# Patient Record
Sex: Female | Born: 1990 | Race: Black or African American | Hispanic: No | Marital: Single | Smoking: Never smoker
Health system: Southern US, Community
[De-identification: ages and names within clinical notes are randomized; demographics above are authoritative.]

## PROBLEM LIST (undated history)

## (undated) DIAGNOSIS — D649 Anemia, unspecified: Secondary | ICD-10-CM

## (undated) HISTORY — DX: Anemia, unspecified: D64.9

## (undated) HISTORY — PX: EYE SURGERY: SHX253

---
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
## 2009-03-19 ENCOUNTER — Emergency Department (HOSPITAL_COMMUNITY): Admission: EM | Admit: 2009-03-19 | Discharge: 2009-03-19 | Payer: Self-pay | Admitting: Emergency Medicine

## 2010-12-01 LAB — COMPREHENSIVE METABOLIC PANEL
Albumin: 3.7
Alkaline Phosphatase: 44 — ABNORMAL LOW
BUN: 10
Potassium: 3.8
Total Protein: 6.9

## 2010-12-01 LAB — DIFFERENTIAL
Basophils Relative: 0
Lymphocytes Relative: 6 — ABNORMAL LOW
Lymphs Abs: 0.6 — ABNORMAL LOW
Monocytes Absolute: 0.6
Monocytes Relative: 6
Neutro Abs: 8

## 2010-12-01 LAB — URINE MICROSCOPIC-ADD ON

## 2010-12-01 LAB — POCT URINALYSIS DIP (DEVICE)
Hgb urine dipstick: NEGATIVE
Ketones, ur: >=160 — AB
Specific Gravity, Urine: 1.015
pH: 7

## 2010-12-01 LAB — URINALYSIS, ROUTINE W REFLEX MICROSCOPIC
Bilirubin Urine: NEGATIVE
Glucose, UA: NEGATIVE
Hgb urine dipstick: NEGATIVE
Nitrite: NEGATIVE
Specific Gravity, Urine: 1.036 — ABNORMAL HIGH
pH: 6.5

## 2010-12-01 LAB — CBC
HCT: 32.5 — ABNORMAL LOW
Platelets: 349
RDW: 15.3

## 2010-12-01 LAB — WET PREP, GENITAL

## 2010-12-01 LAB — GC/CHLAMYDIA PROBE AMP, GENITAL
Chlamydia, DNA Probe: NEGATIVE
GC Probe Amp, Genital: NEGATIVE

## 2012-03-11 ENCOUNTER — Emergency Department (INDEPENDENT_AMBULATORY_CARE_PROVIDER_SITE_OTHER): Payer: BC Managed Care – PPO

## 2012-03-11 ENCOUNTER — Encounter (HOSPITAL_COMMUNITY): Payer: Self-pay | Admitting: Emergency Medicine

## 2012-03-11 ENCOUNTER — Emergency Department (HOSPITAL_COMMUNITY)
Admission: EM | Admit: 2012-03-11 | Discharge: 2012-03-11 | Disposition: A | Discharge status: Home / Self Care | Payer: BC Managed Care – PPO | Source: Home / Self Care | Attending: Emergency Medicine | Admitting: Emergency Medicine

## 2012-03-11 DIAGNOSIS — Z23 Encounter for immunization: Secondary | ICD-10-CM

## 2012-03-11 DIAGNOSIS — R7611 Nonspecific reaction to tuberculin skin test without active tuberculosis: Secondary | ICD-10-CM

## 2012-03-11 MED ORDER — INFLUENZA VIRUS VACC SPLIT PF IM SUSP
0.5000 mL | Freq: Once | INTRAMUSCULAR | Status: AC
  Administered 2012-03-11: 0.5 mL via INTRAMUSCULAR

## 2012-03-11 NOTE — ED Provider Notes (Signed)
Chief Complaint  Patient presents with  . 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 She had this placed 03/05/2012 at a [facility] for a job. She went back for a TB test reading 2 days later and it was positive at 20 mm. She can not recall ever having had a TB skin test previously or a chest x-ray. She has no prior history of TB, known exposure to TB, and no symptoms including no fever, chills, night sweats, weight loss, cough, hemoptysis, shortness of breath, or chest pain. She was in [place] in 2010 in [place] in 2012.  Review of Systems:  Other than noted above, the patient denies any of the following symptoms. Systemic:  No fever, chills, sweats, fatigue, myalgias, headache, or anorexia. Eye:  No redness, pain or drainage. ENT:  No earache, nasal congestion, rhinorrhea, sinus pressure, or sore throat. Lungs:  No cough, sputum production, wheezing, shortness of breath.  Cardiovascular:  No chest pain, palpitations, or syncope. GI:  No nausea, vomiting, abdominal pain or diarrhea. GU:  No dysuria, frequency, or hematuria. Skin:  No rash or pruritis.  PMFSH:  Past medical history, family history, social history, meds, and allergies were reviewed.   Physical Exam:   Vital signs:  BP 110/60  Temp 98.9 F (37.2 C) (Oral)  Resp 18  SpO2 100%  LMP 02/26/2012 General:  Alert, in no distress. Eye:  PERRL, full EOMs.  Lids and conjunctivas were normal. ENT:  TMs and canals were normal, without erythema or inflammation.  Nasal mucosa was clear and uncongested, without drainage.  Mucous membranes were moist.  Pharynx was clear, without exudate or drainage.  There were no oral ulcerations or lesions. Neck:  Supple, no adenopathy, tenderness or mass. Thyroid was normal. Lungs:  No respiratory distress.  Lungs were clear to auscultation, without wheezes, rales or rhonchi.  Breath  sounds were clear and equal bilaterally. Heart:  Regular rhythm, without gallops, murmers or rubs. Abdomen:  Soft, flat, and non-tender to palpation.  No hepatosplenomagaly or mass. Skin:  Clear, warm, and dry, without rash or lesions.  Radiology:  Dg Chest 2 View  03/11/2012  *RADIOLOGY REPORT*  Clinical Data: Positive TB skin test.  CHEST - 2 VIEW  Comparison: None.  Findings: Lungs clear.  Heart size and pulmonary vascularity normal.  No effusion.  Visualized bones unremarkable.  IMPRESSION: No acute disease   Original Report Authenticated By: D. Andria Rhein, MD    Course in Urgent Care Center:   She was given a flu shot today.  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 Therefore she needs to take INH 300 mg daily for 9 months. I referred the [facility] health Department for this.  Plan:   1.  The following meds were prescribed:   New Prescriptions   No medications on file   2.  The patient was instructed in symptomatic care and handouts were given. 3.  The patient was told to return if becoming worse in any way, if no better in 3 or 4 days, and given some red flag symptoms that would indicate earlier return.    Reuben Likes, MD 03/11/12 [phone]

## 2012-03-11 NOTE — ED Notes (Signed)
Waiting discharge papers 

## 2012-03-11 NOTE — ED Notes (Signed)
Pt had tb testing done at [facility] and test was positive. Pt here for chest x ray.

## 2012-07-30 ENCOUNTER — Encounter (INDEPENDENT_AMBULATORY_CARE_PROVIDER_SITE_OTHER): Payer: BC Managed Care – PPO

## 2012-07-31 ENCOUNTER — Telehealth: Payer: Self-pay

## 2012-07-31 NOTE — Telephone Encounter (Signed)
Pt came in on Monday and did not stay and would like a refund of her payment from 07/30/12   Best number [phone]

## 2012-08-01 NOTE — Telephone Encounter (Signed)
Noted refund request in transaction inquiry, tried to call pt but mailbox is full. Pt will receive refund via check after approved.

## 2012-08-01 NOTE — Telephone Encounter (Signed)
Spoke with pt, advised refund will be mailed. Confirmed address. OK

## 2012-09-25 ENCOUNTER — Emergency Department (HOSPITAL_COMMUNITY)
Admission: EM | Admit: 2012-09-25 | Discharge: 2012-09-25 | Disposition: A | Discharge status: Home / Self Care | Payer: BC Managed Care – PPO | Source: Home / Self Care | Attending: Emergency Medicine | Admitting: Emergency Medicine

## 2012-09-25 ENCOUNTER — Encounter (HOSPITAL_COMMUNITY): Payer: Self-pay | Admitting: Emergency Medicine

## 2012-09-25 DIAGNOSIS — N390 Urinary tract infection, site not specified: Secondary | ICD-10-CM

## 2012-09-25 LAB — POCT URINALYSIS DIP (DEVICE)
Bilirubin Urine: NEGATIVE
Glucose, UA: NEGATIVE mg/dL
Ketones, ur: NEGATIVE mg/dL

## 2012-09-25 LAB — POCT PREGNANCY, URINE: Preg Test, Ur: NEGATIVE

## 2012-09-25 MED ORDER — IBUPROFEN 600 MG PO TABS: 600.0000 mg | ORAL_TABLET | Freq: Four times a day (QID) | ORAL | Status: DC | PRN

## 2012-09-25 MED ORDER — CEPHALEXIN 500 MG PO CAPS: 500.0000 mg | ORAL_CAPSULE | Freq: Four times a day (QID) | ORAL | Status: AC

## 2012-09-25 NOTE — ED Notes (Signed)
Pt reports lower back pain x 3 days along with urgency to urinate, concentrated/cloudy urine. Pt has not tried any otc meds for symptoms. Denies fever and any other symptoms.

## 2012-09-25 NOTE — ED Provider Notes (Signed)
CSN: [id number]     Arrival date & time 09/25/12  1434 History     First MD Initiated Contact with Patient 09/25/12 1450     Chief Complaint  Patient presents with  . Urinary Tract Infection    lower back pain x 3 days.    (Consider location/radiation/quality/duration/timing/severity/associated sxs/prior Treatment) HPI Comments: Patient presents urgent care describing that for the last 2-3 days she's been having pressure burning and discomfort with urination. Her urine looks concentrated and looks cloudy. She feels she has a urinary tract infection the last time she had this was when she was 22 years of age. She denies any fevers nausea vomiting or flank pain but does have lower back pain or discomfort. She is not nauseous and has not had any vomiting. Without movement and at rest she denies any pain in her abdomen.  She has been taking an over-the-counter product for the last 2-3 days, for urinary symptoms.  Patient is a 22 y.o. female presenting with urinary tract infection. The history is provided by the patient.  Urinary Tract Infection This is a new problem. The current episode started more than 2 days ago. The problem occurs constantly. The problem has not changed since onset.Associated symptoms include abdominal pain. Pertinent negatives include no shortness of breath. Exacerbated by: urination. Nothing relieves the symptoms. She has tried nothing for the symptoms. The treatment provided no relief.    Past Medical History  Diagnosis Date  . Anemia    Past Surgical History  Procedure Laterality Date  . Eye surgery     History reviewed. No pertinent family history. History  Substance Use Topics  . Smoking status: Never Smoker   . Smokeless tobacco: Not on file  . Alcohol Use: Yes     Comment: occasional   OB History   Grav Para Term Preterm Abortions TAB SAB Ect Mult Living                 Review of Systems  Constitutional: Negative for fever, chills, activity change  and appetite change.  Respiratory: Negative for shortness of breath.   Gastrointestinal: Positive for abdominal pain.  Genitourinary: Positive for dysuria, urgency and frequency. Negative for flank pain, vaginal bleeding, vaginal discharge, difficulty urinating and pelvic pain.  Skin: Negative for pallor and rash.    Allergies  Review of patient's allergies indicates no known allergies.  Home Medications   Current Outpatient Rx  Name  Route  Sig  Dispense  Refill  . cephALEXin (KEFLEX) 500 MG capsule   Oral   Take 1 capsule (500 mg total) by mouth 4 (four) times daily.   28 capsule   0   . ibuprofen (ADVIL,MOTRIN) 600 MG tablet   Oral   Take 1 tablet (600 mg total) by mouth every 6 (six) hours as needed for pain.   15 tablet   0    BP 121/78  Pulse 75  Temp(Src) 98.6 F (37 C) (Oral)  Resp 18  SpO2 100%  LMP 09/11/2012 Physical Exam  Nursing note and vitals reviewed. Constitutional: She appears well-developed and well-nourished.  Eyes: Conjunctivae are normal. No scleral icterus.  Abdominal: Soft. Normal appearance. There is no hepatosplenomegaly. There is tenderness in the suprapubic area. There is no rigidity, no rebound, no guarding, no CVA tenderness, no tenderness at McBurney's point and negative Murphy's sign. No hernia.  Musculoskeletal:       Back:  Skin: No rash noted. No erythema.    ED Course  Procedures (including critical care time)  Labs Reviewed  POCT URINALYSIS DIP (DEVICE) - Abnormal; Notable for the following:    Hgb urine dipstick MODERATE (*)    Protein, ur 30 (*)    Nitrite POSITIVE (*)    Leukocytes, UA MODERATE (*)    All other components within normal limits  URINE CULTURE  POCT PREGNANCY, URINE   No results found. 1. Urinary tract infection     MDM  Symptoms and exam were most consistent with uncomplicated urinary tract infection. Start patient on Keflex to be taken every 6 hours for the first 48 hours and then to space out  every 8 hours. Instructed patient to drink abundant fluids. Discuss with patient symptoms that should warrant her return for further evaluation more specifically nausea vomiting fevers or flank pain. Otherwise patient to start with antibiotics immediately.  Patient agrees with treatment plan followup care as necessary. A urine culture has been ordered    Jimmie Molly, MD 09/25/12 1554

## 2012-09-27 LAB — URINE CULTURE

## 2012-09-27 NOTE — ED Notes (Signed)
Urine culture: >100,000 colonies E. Coli.  Pt. adequately treated with Keflex. Cynthia Guerra 09/27/2012

## 2012-09-28 NOTE — Progress Notes (Signed)
This encounter was created in error - please disregard.

## 2013-07-19 IMAGING — CR DG CHEST 2V
2 series · 2 of 2 positions shown · non-contrast
Comparison: None.

CLINICAL DATA: Positive TB skin test.

CHEST - 2 VIEW

[view not recorded (1 of 2)]
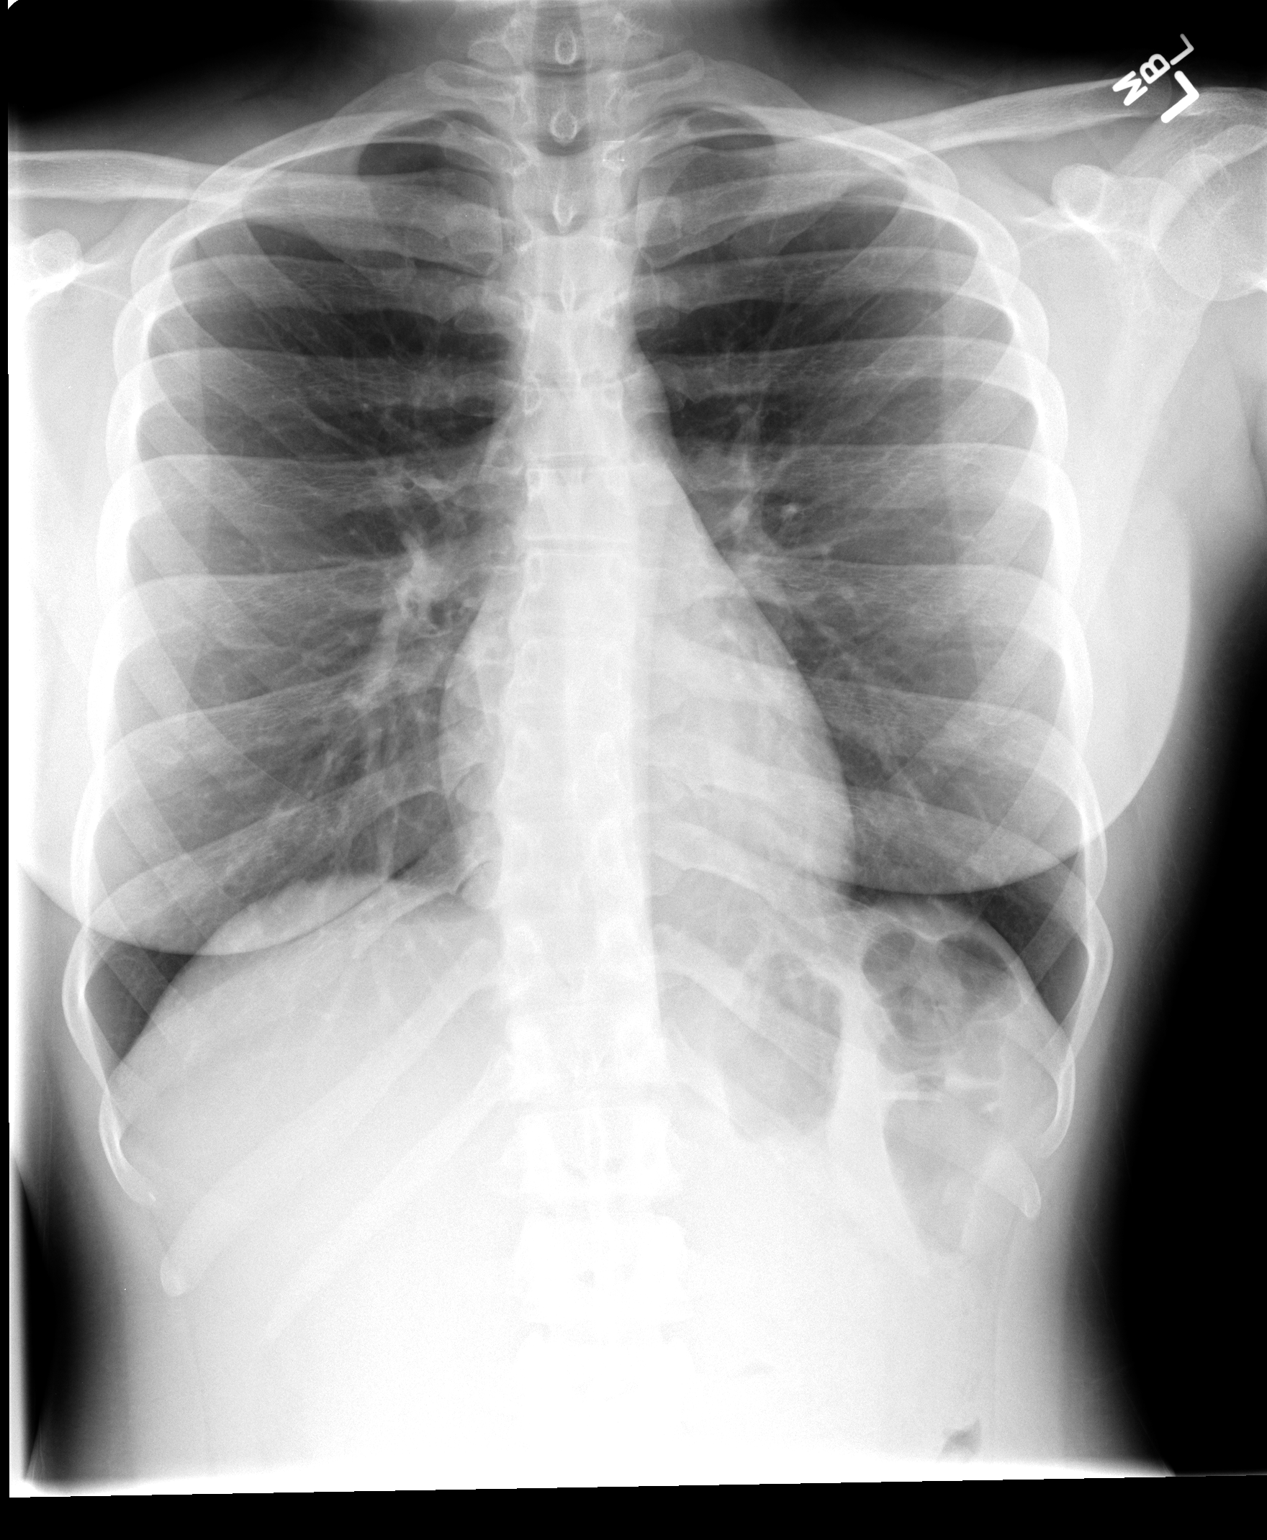

[view not recorded (2 of 2)]
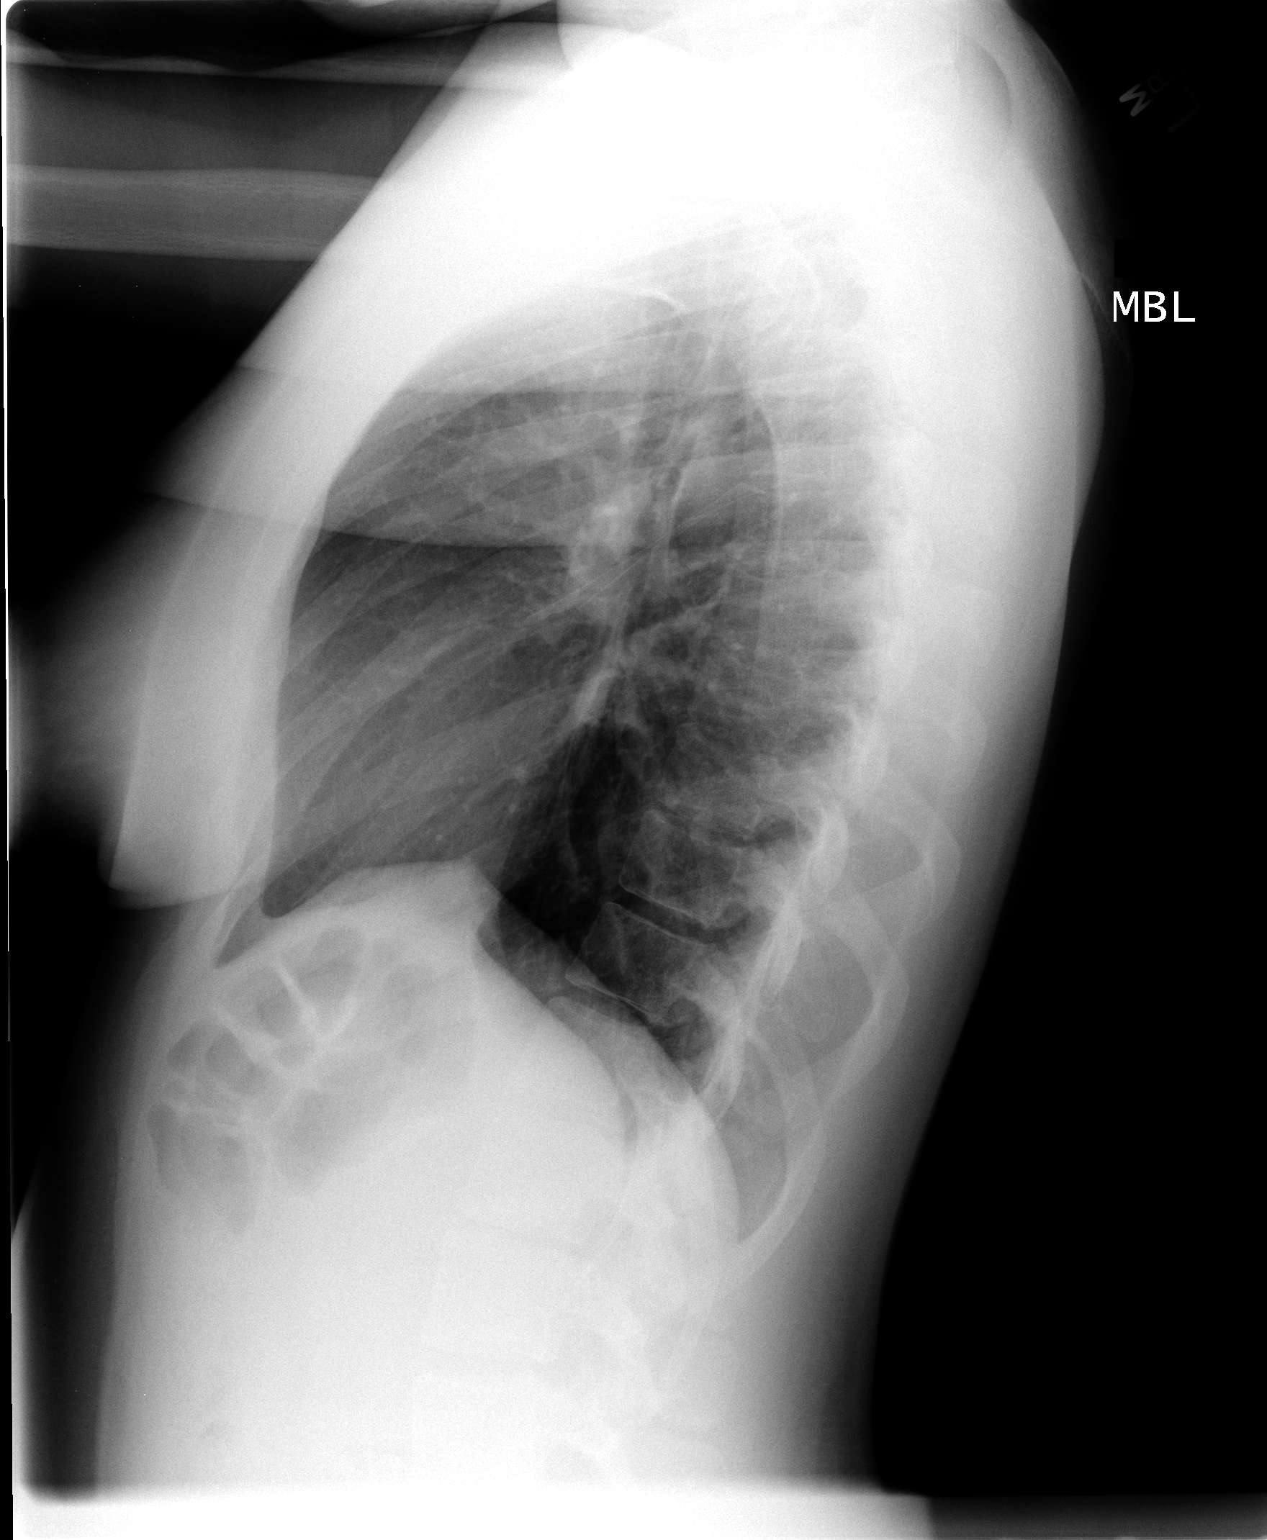

[2 of 2 positions shown; findings below may reference images not displayed]

FINDINGS: Lungs clear.  Heart size and pulmonary vascularity
normal.  No effusion.  Visualized bones unremarkable.
IMPRESSION: No acute disease

## 2013-11-25 ENCOUNTER — Ambulatory Visit (INDEPENDENT_AMBULATORY_CARE_PROVIDER_SITE_OTHER): Payer: BC Managed Care – PPO | Admitting: Family Medicine

## 2013-11-25 VITALS — BP 104/62 | HR 88 | Temp 97.8°F | Resp 16 | Ht 66.5 in | Wt 156.1 lb

## 2013-11-25 DIAGNOSIS — B9689 Other specified bacterial agents as the cause of diseases classified elsewhere: Secondary | ICD-10-CM

## 2013-11-25 DIAGNOSIS — N760 Acute vaginitis: Secondary | ICD-10-CM

## 2013-11-25 DIAGNOSIS — A749 Chlamydial infection, unspecified: Secondary | ICD-10-CM

## 2013-11-25 DIAGNOSIS — A499 Bacterial infection, unspecified: Secondary | ICD-10-CM

## 2013-11-25 DIAGNOSIS — N898 Other specified noninflammatory disorders of vagina: Secondary | ICD-10-CM

## 2013-11-25 DIAGNOSIS — D509 Iron deficiency anemia, unspecified: Secondary | ICD-10-CM

## 2013-11-25 LAB — POCT CBC
Granulocyte percent: 50.6 %G (ref 37–80)
HEMATOCRIT: 37.6 % — AB (ref 37.7–47.9)
HEMOGLOBIN: 11.6 g/dL — AB (ref 12.2–16.2)
LYMPH, POC: 3.2 (ref 0.6–3.4)
MCH: 23.4 pg — AB (ref 27–31.2)
MCHC: 30.8 g/dL — AB (ref 31.8–35.4)
MCV: 75.8 fL — AB (ref 80–97)
MID (cbc): 0.3 (ref 0–0.9)
MPV: 7.5 fL (ref 0–99.8)
POC Granulocyte: 3.5 (ref 2–6.9)
POC LYMPH PERCENT: 45.4 %L (ref 10–50)
POC MID %: 4 % (ref 0–12)
Platelet Count, POC: 315 10*3/uL (ref 142–424)
RBC: 4.96 M/uL (ref 4.04–5.48)
RDW, POC: 16 %
WBC: 7 10*3/uL (ref 4.6–10.2)

## 2013-11-25 LAB — POCT WET PREP WITH KOH
KOH Prep POC: NEGATIVE
TRICHOMONAS UA: NEGATIVE
YEAST WET PREP PER HPF POC: NEGATIVE

## 2013-11-25 LAB — POCT URINALYSIS DIPSTICK
Bilirubin, UA: NEGATIVE
GLUCOSE UA: NEGATIVE
KETONES UA: NEGATIVE
Leukocytes, UA: NEGATIVE
Nitrite, UA: NEGATIVE
PROTEIN UA: NEGATIVE
RBC UA: NEGATIVE
SPEC GRAV UA: 1.02
Urobilinogen, UA: 0.2
pH, UA: 7.5

## 2013-11-25 LAB — POCT UA - MICROSCOPIC ONLY
CRYSTALS, UR, HPF, POC: NEGATIVE
Casts, Ur, LPF, POC: NEGATIVE
MUCUS UA: NEGATIVE
RBC, URINE, MICROSCOPIC: NEGATIVE
Yeast, UA: NEGATIVE

## 2013-11-25 LAB — POCT URINE PREGNANCY: Preg Test, Ur: NEGATIVE

## 2013-11-25 MED ORDER — CEFTRIAXONE SODIUM 1 G IJ SOLR
250.0000 mg | Freq: Once | INTRAMUSCULAR | Status: AC
  Administered 2013-11-25: 250 mg via INTRAMUSCULAR

## 2013-11-25 MED ORDER — FERROUS SULFATE 325 (65 FE) MG PO TABS: 325.0000 mg | ORAL_TABLET | Freq: Every day | ORAL | Status: AC

## 2013-11-25 MED ORDER — METRONIDAZOLE 500 MG PO TABS: 500.0000 mg | ORAL_TABLET | Freq: Two times a day (BID) | ORAL | Status: DC

## 2013-11-25 MED ORDER — DOXYCYCLINE HYCLATE 100 MG PO CAPS: 100.0000 mg | ORAL_CAPSULE | Freq: Two times a day (BID) | ORAL | Status: DC

## 2013-11-25 NOTE — Progress Notes (Signed)
Subjective:  This chart was scribed for Norberto Sorenson, MD, by Elon Spanner, ED Scribe. This patient was seen in room Rm 5 and the patient's care was started at 6:12 PM.    Patient ID: Cynthia Guerra, female    DOB: 08/28/1990, 23 y.o.   MRN: [id number] Chief Complaint  Patient presents with  . STD Check    HPI HPI Comments: Cynthia Guerra is a 23 y.o. female who presents to the Emergency Department needing an STD screening.  Patient reports vaginal discharge and discomfort onset 1 weeks ago.  Patient was diagnosed with chlamydia at [facility] one year ago.  She was treated with 1 g Rocephin.  Patient had a lot of nausea, vomiting and upset stomach with antibiotic.  Symptoms recurred and two weeks ago the patient experienced a similar episode but this time did not have any upset stomach with the azithromycin so she believes it was not effective.  She has had a new partner within the previous year and reports her partner has also been treated for the chlamydia within the last two weeks. She uses condoms only and refuses to use birth control due to concerns that it will make her sterile.   Patient requests to be tested for all STDs as well as an alternative treatment for the azithromycin.  Patient denies using any OTC medications for treatment.  Patient denies fever, chills, nausea, vomiting, vaginal pain, vaginal itching, dysuria.  Patient reports she has been having normal periods - LNMP 9/7.    Past Medical History  Diagnosis Date  . Anemia    No current outpatient prescriptions on file prior to visit.   No current facility-administered medications on file prior to visit.   No Known Allergies   Review of Systems  Constitutional: Negative for fever and chills.  Gastrointestinal: Negative for nausea and vomiting.  Genitourinary: Positive for vaginal discharge. Negative for dysuria and vaginal pain.       Objective:  BP 104/62  Pulse 88  Temp(Src) 97.8 F (36.6 C) (Oral)  Resp 16   Ht 5' 6.5" (1.689 m)  Wt 156 lb 2 oz (70.818 kg)  BMI 24.82 kg/m2  SpO2 100%  LMP 11/04/2013  Physical Exam  Nursing note and vitals reviewed. Constitutional: She is oriented to person, place, and time. She appears well-developed and well-nourished. No distress.  HENT:  Head: Normocephalic and atraumatic.  Eyes: Conjunctivae and EOM are normal.  Neck: Neck supple. No tracheal deviation present.  Cardiovascular: Normal rate.   Pulmonary/Chest: Effort normal. No respiratory distress.  Genitourinary:  Normal labia and vagina with moderate amount of yellow/green thin discharge.  Cervix friable.  No CMT.  No uterine adnexal tenderness, fullness, or masses.    Musculoskeletal: Normal range of motion.  Neurological: She is alert and oriented to person, place, and time.  Skin: Skin is warm and dry.  Psychiatric: She has a normal mood and affect. Her behavior is normal.       Assessment & Plan:   Vaginal discharge - Plan: POCT CBC, POCT urine pregnancy, POCT UA - Microscopic Only, POCT urinalysis dipstick, POCT Wet Prep with KOH, GC/Chlamydia Probe Amp, HIV antibody, RPR, Hepatitis C antibody, HSV(herpes simplex vrs) 1+2 ab-IgG, cefTRIAXone (ROCEPHIN) injection 250 mg  Chlamydia - Plan: POCT CBC, POCT urine pregnancy, POCT UA - Microscopic Only, POCT urinalysis dipstick, POCT Wet Prep with KOH, GC/Chlamydia Probe Amp, HIV antibody, RPR, Hepatitis C antibody, HSV(herpes simplex vrs) 1+2 ab-IgG, cefTRIAXone (ROCEPHIN) injection 250 mg  Anemia, iron deficiency - Plan: Ferritin  Bacterial vaginosis  Meds ordered this encounter  Medications  . cefTRIAXone (ROCEPHIN) injection 250 mg    Sig:     Order Specific Question:  Antibiotic Indication:    Answer:  STD  . doxycycline (VIBRAMYCIN) 100 MG capsule    Sig: Take 1 capsule (100 mg total) by mouth 2 (two) times daily.    Dispense:  14 capsule    Refill:  0  . metroNIDAZOLE (FLAGYL) 500 MG tablet    Sig: Take 1 tablet (500 mg total) by  mouth 2 (two) times daily.    Dispense:  14 tablet    Refill:  0  . ferrous sulfate 325 (65 FE) MG tablet    Sig: Take 1 tablet (325 mg total) by mouth daily with breakfast.    Dispense:  90 tablet    Refill:  3    I personally performed the services described in this documentation, which was scribed in my presence. The recorded information has been reviewed and considered, and addended by me as needed.  Norberto Sorenson, MD MPH  Results for orders placed in visit on 11/25/13  GC/CHLAMYDIA PROBE AMP      Result Value Ref Range   CT Probe RNA NEGATIVE     GC Probe RNA NEGATIVE    HIV ANTIBODY (ROUTINE TESTING)      Result Value Ref Range   HIV 1&2 Ab, 4th Generation NONREACTIVE  NONREACTIVE  RPR      Result Value Ref Range   RPR NON REAC  NON REAC  HEPATITIS C ANTIBODY      Result Value Ref Range   HCV Ab NEGATIVE  NEGATIVE  HSV(HERPES SIMPLEX VRS) I + II AB-IGG      Result Value Ref Range   HSV 1 Glycoprotein G Ab, IgG <0.10     HSV 2 Glycoprotein G Ab, IgG <0.10    FERRITIN      Result Value Ref Range   Ferritin 28  10 - 291 ng/mL  POCT CBC      Result Value Ref Range   WBC 7.0  4.6 - 10.2 K/uL   Lymph, poc 3.2  0.6 - 3.4   POC LYMPH PERCENT 45.4  10 - 50 %L   MID (cbc) 0.3  0 - 0.9   POC MID % 4.0  0 - 12 %M   POC Granulocyte 3.5  2 - 6.9   Granulocyte percent 50.6  37 - 80 %G   RBC 4.96  4.04 - 5.48 M/uL   Hemoglobin 11.6 (*) 12.2 - 16.2 g/dL   HCT, POC 16.1 (*) 09.6 - 47.9 %   MCV 75.8 (*) 80 - 97 fL   MCH, POC 23.4 (*) 27 - 31.2 pg   MCHC 30.8 (*) 31.8 - 35.4 g/dL   RDW, POC 04.5     Platelet Count, POC 315  142 - 424 K/uL   MPV 7.5  0 - 99.8 fL  POCT URINE PREGNANCY      Result Value Ref Range   Preg Test, Ur Negative    POCT UA - MICROSCOPIC ONLY      Result Value Ref Range   WBC, Ur, HPF, POC 0-1     RBC, urine, microscopic neg     Bacteria, U Microscopic trace     Mucus, UA neg     Epithelial cells, urine per micros 0-2     Crystals, Ur, HPF, POC neg  Casts, Ur, LPF, POC neg     Yeast, UA neg    POCT URINALYSIS DIPSTICK      Result Value Ref Range   Color, UA yellow     Clarity, UA clear     Glucose, UA neg     Bilirubin, UA neg     Ketones, UA neg     Spec Grav, UA 1.020     Blood, UA neg     pH, UA 7.5     Protein, UA neg     Urobilinogen, UA 0.2     Nitrite, UA neg     Leukocytes, UA Negative    POCT WET PREP WITH KOH      Result Value Ref Range   Trichomonas, UA Negative     Clue Cells Wet Prep HPF POC 5-8     Epithelial Wet Prep HPF POC 1-3     Yeast Wet Prep HPF POC neg     Bacteria Wet Prep HPF POC 2+     RBC Wet Prep HPF POC 0-2     WBC Wet Prep HPF POC 20-30     KOH Prep POC Negative

## 2013-11-25 NOTE — Patient Instructions (Addendum)
Start an iron supplement twice a day - I recommend ferrous sulfate  (  of elemental iron).  Take this on an empty stomach 30 minutes before eating or 2 hours after a meal.  Take it with a vitamin C supplement or a small glass of orange juice.   Iron-Rich Diet An iron-rich diet contains foods that are good sources of iron. Iron is an important mineral that helps your body produce hemoglobin. Hemoglobin is a protein in red blood cells that carries oxygen to the body's tissues. Sometimes, the iron level in your blood can be low. This may be caused by:  A lack of iron in your diet.  Blood loss.  Times of growth, such as during pregnancy or during a child's growth and development. Low levels of iron can cause a decrease in the number of red blood cells. This can result in iron deficiency anemia. Iron deficiency anemia symptoms include:  Tiredness.  Weakness.  Irritability.  Increased chance of infection. Here are some recommendations for daily iron intake:  Males older than 24 years of age need 8 mg of iron per day.  Women ages 40 to 20 need 18 mg of iron per day.  Pregnant women need 27 mg of iron per day, and women who are over 4 years of age and breastfeeding need 9 mg of iron per day.  Women over the age of 75 need 8 mg of iron per day. SOURCES OF IRON There are 2 types of iron that are found in food: heme iron and nonheme iron. Heme iron is absorbed by the body better than nonheme iron. Heme iron is found in meat, poultry, and fish. Nonheme iron is found in grains, beans, and vegetables. Heme Iron Sources Food / Iron (mg)  Chicken liver, 3 oz (85 g)/ 10 mg  Beef liver, 3 oz (85 g)/ 5.5 mg  Oysters, 3 oz (85 g)/ 8 mg  Beef, 3 oz (85 g)/ 2 to 3 mg  Shrimp, 3 oz (85 g)/ 2.8 mg  [place], 3 oz (85 g)/ 2 mg  Chicken, 3 oz (85 g) / 1 mg  Fish (tuna, halibut), 3 oz (85 g)/ 1 mg  Pork, 3 oz (85 g)/ 0.9 mg Nonheme Iron Sources Food / Iron (mg)  Ready-to-eat  breakfast cereal, iron-fortified / 3.9 to 7 mg  Tofu,  cup / 3.4 mg  Kidney beans,  cup / 2.6 mg  Baked potato with skin / 2.7 mg  Asparagus,  cup / 2.2 mg  Avocado / 2 mg  Dried peaches,  cup / 1.6 mg  Raisins,  cup / 1.5 mg  Soy milk, 1 cup / 1.5 mg  Whole-wheat bread, 1 slice / 1.2 mg  Spinach, 1 cup / 0.8 mg  Broccoli,  cup / 0.6 mg IRON ABSORPTION Certain foods can decrease the body's absorption of iron. Try to avoid these foods and beverages while eating meals with iron-containing foods:  Coffee.  Tea.  Fiber.  Soy. Foods containing vitamin C can help increase the amount of iron your body absorbs from iron sources, especially from nonheme sources. Eat foods with vitamin C along with iron-containing foods to increase your iron absorption. Foods that are high in vitamin C include many fruits and vegetables. Some good sources are:  Fresh orange juice.  Oranges.  Strawberries.  Mangoes.  Grapefruit.  Red bell peppers.  Green bell peppers.  Broccoli.  Potatoes with skin.  Tomato juice. Document Released: 09/28/2004 Document Revised: 05/09/2011 Document Reviewed: 08/05/2010  [employer] Patient Information 2015 [place], [place]. This information is not intended to replace advice given to you by your health care provider. Make sure you discuss any questions you have with your health care provider.   Iron Deficiency Anemia Anemia is a condition in which there are less red blood cells or hemoglobin in the blood than normal. Hemoglobin is the part of red blood cells that carries oxygen. Iron deficiency anemia is anemia caused by too little iron. It is the most common type of anemia. It may leave you tired and short of breath. CAUSES   Lack of iron in the diet.  Poor absorption of iron, as seen with intestinal disorders.  Intestinal bleeding.  Heavy periods. SIGNS AND SYMPTOMS  Mild anemia may not be noticeable. Symptoms may  include:  Fatigue.  Headache.  Pale skin.  Weakness.  Tiredness.  Shortness of breath.  Dizziness.  Cold hands and feet.  Fast or irregular heartbeat. DIAGNOSIS  Diagnosis requires a thorough evaluation and physical exam by your health care provider. Blood tests are generally used to confirm iron deficiency anemia. Additional tests may be done to find the underlying cause of your anemia. These may include:  Testing for blood in the stool (fecal occult blood test).  A procedure to see inside the colon and rectum (colonoscopy).  A procedure to see inside the esophagus and stomach (endoscopy). TREATMENT  Iron deficiency anemia is treated by correcting the cause of the deficiency. Treatment may involve:  Adding iron-rich foods to your diet.  Taking iron supplements. Pregnant or breastfeeding women need to take extra iron because their normal diet usually does not provide the required amount.  Taking vitamins. Vitamin C improves the absorption of iron. Your health care provider may recommend that you take your iron tablets with a glass of orange juice or vitamin C supplement.  Medicines to make heavy menstrual flow lighter.  Surgery. HOME CARE INSTRUCTIONS   Take iron as directed by your health care provider.  If you cannot tolerate taking iron supplements by mouth, talk to your health care provider about taking them through a vein (intravenously) or an injection into a muscle.  For the best iron absorption, iron supplements should be taken on an empty stomach. If you cannot tolerate them on an empty stomach, you may need to take them with food.  Do not drink milk or take antacids at the same time as your iron supplements. Milk and antacids may interfere with the absorption of iron.  Iron supplements can cause constipation. Make sure to include fiber in your diet to prevent constipation. A stool softener may also be recommended.  Take vitamins as directed by your health  care provider.  Eat a diet rich in iron. Foods high in iron include liver, lean beef, whole-grain bread, eggs, dried fruit, and dark green leafy vegetables. SEEK IMMEDIATE MEDICAL CARE IF:   You faint. If this happens, do not drive. Call your local emergency services (911 in [place] if no other help is available.  You have chest pain.  You feel nauseous or vomit.  You have severe or increased shortness of breath with activity.  You feel weak.  You have a rapid heartbeat.  You have unexplained sweating.  You become light-headed when getting up from a chair or bed. MAKE SURE YOU:   Understand these instructions.  Will watch your condition.  Will get help right away if you are not doing well or get worse. Document Released: 02/12/2000 Document Revised: 02/19/2013  Document Reviewed: 10/22/2012 [facility] Patient Information 2015 [place], [place]. This information is not intended to replace advice given to you by your health care provider. Make sure you discuss any questions you have with your health care provider.   Bacterial Vaginosis Bacterial vaginosis is a vaginal infection that occurs when the normal balance of bacteria in the vagina is disrupted. It results from an overgrowth of certain bacteria. This is the most common vaginal infection in women of childbearing age. Treatment is important to prevent complications, especially in pregnant women, as it can cause a premature delivery. CAUSES  Bacterial vaginosis is caused by an increase in harmful bacteria that are normally present in smaller amounts in the vagina. Several different kinds of bacteria can cause bacterial vaginosis. However, the reason that the condition develops is not fully understood. RISK FACTORS Certain activities or behaviors can put you at an increased risk of developing bacterial vaginosis, including:  Having a new sex partner or multiple sex partners.  Douching.  Using an intrauterine device (IUD) for  contraception. Women do not get bacterial vaginosis from toilet seats, bedding, swimming pools, or contact with objects around them. SIGNS AND SYMPTOMS  Some women with bacterial vaginosis have no signs or symptoms. Common symptoms include:  Grey vaginal discharge.  A fishlike odor with discharge, especially after sexual intercourse.  Itching or burning of the vagina and vulva.  Burning or pain with urination. DIAGNOSIS  Your health care provider will take a medical history and examine the vagina for signs of bacterial vaginosis. A sample of vaginal fluid may be taken. Your health care provider will look at this sample under a microscope to check for bacteria and abnormal cells. A vaginal pH test may also be done.  TREATMENT  Bacterial vaginosis may be treated with antibiotic medicines. These may be given in the form of a pill or a vaginal cream. A second round of antibiotics may be prescribed if the condition comes back after treatment.  HOME CARE INSTRUCTIONS   Only take over-the-counter or prescription medicines as directed by your health care provider.  If antibiotic medicine was prescribed, take it as directed. Make sure you finish it even if you start to feel better.  Do not have sex until treatment is completed.  Tell all sexual partners that you have a vaginal infection. They should see their health care provider and be treated if they have problems, such as a mild rash or itching.  Practice safe sex by using condoms and only having one sex partner. SEEK MEDICAL CARE IF:   Your symptoms are not improving after 3 days of treatment.  You have increased discharge or pain.  You have a fever. MAKE SURE YOU:   Understand these instructions.  Will watch your condition.  Will get help right away if you are not doing well or get worse. FOR MORE INFORMATION  Centers for Disease Control and Prevention, Division of STD Prevention: [employer]Apps[employer]za American Sexual Health  Association (ASHA): www.ashastd.org  Document Released: 02/14/2005 Document Revised: 12/05/2012 Document Reviewed: 09/26/2012 [facility] Patient Information 2015 [place], [place]. This information is not intended to replace advice given to you by your health care provider. Make sure you discuss any questions you have with your health care provider.  Chlamydia Chlamydia is an infection. It is spread through sexual contact. Chlamydia can be in different areas of the body. These areas include the cervix, urethra, throat, or rectum. You may not know you have chlamydia because many people never develop the symptoms.  Chlamydia is not difficult to treat once you know you have it. However, if it is left untreated, chlamydia can lead to more serious health problems.  CAUSES  Chlamydia is caused by bacteria. It is a sexually transmitted disease. It is passed from an infected partner during intimate contact. This contact could be with the genitals, mouth, or rectal area. Chlamydia can also be passed from mothers to babies during birth. SIGNS AND SYMPTOMS  There may not be any symptoms. This is often the case early in the infection. If symptoms develop, they may include:  Mild pain and discomfort when urinating.  Redness, soreness, and swelling (inflammation) of the rectum.  Vaginal discharge.  Painful intercourse.  Abdominal pain.  Bleeding between menstrual periods. DIAGNOSIS  To diagnose this infection, your health care provider will do a pelvic exam. Cultures will be taken of the vagina, cervix, urine, and possibly the rectum to verify the diagnosis.  TREATMENT You will be given antibiotic medicines. If you are pregnant, certain types of antibiotics will need to be avoided. Any sexual partners should also be treated, even if they do not show symptoms.  HOME CARE INSTRUCTIONS   Take your antibiotic medicine as directed by your health care provider. Finish the antibiotic even if you start to feel  better.  Take medicines only as directed by your health care provider.  Inform any sexual partners about the infection. They should also be treated.  Do not have sexual contact until your health care provider tells you it is okay.  Get plenty of rest.  Eat a well-balanced diet.  Drink enough fluids to keep your urine clear or pale yellow.  Keep all follow-up visits as directed by your health care provider. SEEK MEDICAL CARE IF:  You have painful urination.  You have abdominal pain.  You have vaginal discharge.  You have painful sexual intercourse.  You have bleeding between periods and after sex.  You have a fever. SEEK IMMEDIATE MEDICAL CARE IF:   You experience nausea or vomiting.  You experience excessive sweating (diaphoresis).  You have difficulty swallowing. MAKE SURE YOU:   Understand these instructions.  Will watch your condition.  Will get help right away if you are not doing well or get worse. Document Released: 11/24/2004 Document Revised: 07/01/2013 Document Reviewed: 10/22/2012 [facility] Patient Information 2015 [place], [place]. This information is not intended to replace advice given to you by your health care provider. Make sure you discuss any questions you have with your health care provider. Contraception Choices Contraception (birth control) is the use of any methods or devices to prevent pregnancy. Below are some methods to help avoid pregnancy. HORMONAL METHODS   Contraceptive implant. This is a thin, plastic tube containing progesterone hormone. It does not contain estrogen hormone. Your health care provider inserts the tube in the inner part of the upper arm. The tube can remain in place for up to 3 years. After 3 years, the implant must be removed. The implant prevents the ovaries from releasing an egg (ovulation), thickens the cervical mucus to prevent sperm from entering the uterus, and thins the lining of the inside of the  uterus.  Progesterone-only injections. These injections are given every 3 months by your health care provider to prevent pregnancy. This synthetic progesterone hormone stops the ovaries from releasing eggs. It also thickens cervical mucus and changes the uterine lining. This makes it harder for sperm to survive in the uterus.  Birth control pills. These pills contain estrogen and progesterone  hormone. They work by preventing the ovaries from releasing eggs (ovulation). They also cause the cervical mucus to thicken, preventing the sperm from entering the uterus. Birth control pills are prescribed by a health care provider.Birth control pills can also be used to treat heavy periods.  Minipill. This type of birth control pill contains only the progesterone hormone. They are taken every day of each month and must be prescribed by your health care provider.  Birth control patch. The patch contains hormones similar to those in birth control pills. It must be changed once a week and is prescribed by a health care provider.  Vaginal ring. The ring contains hormones similar to those in birth control pills. It is left in the vagina for 3 weeks, removed for 1 week, and then a new one is put back in place. The patient must be comfortable inserting and removing the ring from the vagina.A health care provider's prescription is necessary.  Emergency contraception. Emergency contraceptives prevent pregnancy after unprotected sexual intercourse. This pill can be taken right after sex or up to 5 days after unprotected sex. It is most effective the sooner you take the pills after having sexual intercourse. Most emergency contraceptive pills are available without a prescription. Check with your pharmacist. Do not use emergency contraception as your only form of birth control. BARRIER METHODS   Female condom. This is a thin sheath (latex or rubber) that is worn over the penis during sexual intercourse. It can be used with  spermicide to increase effectiveness.  Female condom. This is a soft, loose-fitting sheath that is put into the vagina before sexual intercourse.  Diaphragm. This is a soft, latex, dome-shaped barrier that must be fitted by a health care provider. It is inserted into the vagina, along with a spermicidal jelly. It is inserted before intercourse. The diaphragm should be left in the vagina for 6 to 8 hours after intercourse.  Cervical cap. This is a round, soft, latex or plastic cup that fits over the cervix and must be fitted by a health care provider. The cap can be left in place for up to 48 hours after intercourse.  Sponge. This is a soft, circular piece of polyurethane foam. The sponge has spermicide in it. It is inserted into the vagina after wetting it and before sexual intercourse.  Spermicides. These are chemicals that kill or block sperm from entering the cervix and uterus. They come in the form of creams, jellies, suppositories, foam, or tablets. They do not require a prescription. They are inserted into the vagina with an applicator before having sexual intercourse. The process must be repeated every time you have sexual intercourse. INTRAUTERINE CONTRACEPTION  Intrauterine device (IUD). This is a T-shaped device that is put in a woman's uterus during a menstrual period to prevent pregnancy. There are 2 types:  Copper IUD. This type of IUD is wrapped in copper wire and is placed inside the uterus. Copper makes the uterus and fallopian tubes produce a fluid that kills sperm. It can stay in place for 10 years.  Hormone IUD. This type of IUD contains the hormone progestin (synthetic progesterone). The hormone thickens the cervical mucus and prevents sperm from entering the uterus, and it also thins the uterine lining to prevent implantation of a fertilized egg. The hormone can weaken or kill the sperm that get into the uterus. It can stay in place for 3-5 years, depending on which type of IUD  is used. PERMANENT METHODS OF CONTRACEPTION  Female  tubal ligation. This is when the woman's fallopian tubes are surgically sealed, tied, or blocked to prevent the egg from traveling to the uterus.  Hysteroscopic sterilization. This involves placing a small coil or insert into each fallopian tube. Your doctor uses a technique called hysteroscopy to do the procedure. The device causes scar tissue to form. This results in permanent blockage of the fallopian tubes, so the sperm cannot fertilize the egg. It takes about 3 months after the procedure for the tubes to become blocked. You must use another form of birth control for these 3 months.  Female sterilization. This is when the female has the tubes that carry sperm tied off (vasectomy).This blocks sperm from entering the vagina during sexual intercourse. After the procedure, the man can still ejaculate fluid (semen). NATURAL PLANNING METHODS  Natural family planning. This is not having sexual intercourse or using a barrier method (condom, diaphragm, cervical cap) on days the woman could become pregnant.  Calendar method. This is keeping track of the length of each menstrual cycle and identifying when you are fertile.  Ovulation method. This is avoiding sexual intercourse during ovulation.  Symptothermal method. This is avoiding sexual intercourse during ovulation, using a thermometer and ovulation symptoms.  Post-ovulation method. This is timing sexual intercourse after you have ovulated. Regardless of which type or method of contraception you choose, it is important that you use condoms to protect against the transmission of sexually transmitted infections (STIs). Talk with your health care provider about which form of contraception is most appropriate for you. Document Released: 02/14/2005 Document Revised: 02/19/2013 Document Reviewed: 08/09/2012 [facility] Patient Information 2015 [place], [place]. This information is not intended to replace advice  given to you by your health care provider. Make sure you discuss any questions you have with your health care provider. Hormonal Contraception Information Estrogen and progesterone (progestin) are hormones used in many forms of birth control (contraception). These two hormones make up most hormonal contraceptives. Hormonal contraceptives use either:   A combination of estrogen hormone and progesterone hormone in one of these forms:  Pill--Pills come in various combinations of active hormone pills and nonhormonal pills. Different combinations of pills may give you a period once a month, once every 3 months, or no period at all. It is important to take the pills the same time each day.  Patch--The patch is placed on the lower abdomen every week for 3 weeks. On the fourth week, the patch is not placed.  Vaginal ring--The ring is placed in the vagina and left there for 3 weeks. It is then removed for 1 week.  Progesterone alone in one of these forms:  Pill--Hormone pills are taken every day of the cycle.  Intrauterine device (IUD)--The IUD is inserted during a menstrual period and removed or replaced every 5 years or sooner.  Implant--Plastic rods are placed under the skin of the upper arm. They are removed or replaced every 3 years or sooner.  Injection--The injection is given once every 90 days. Pregnancy can still occur with any of these hormonal contraceptive methods. If you have any suspicion that you might be pregnant, take a pregnancy test and talk to your health care provider.  ESTROGEN AND PROGESTERONE CONTRACEPTIVES Estrogen and progesterone contraceptives can prevent pregnancy by:  Stopping the release of an egg (ovulation).  Thickening the mucus of the cervix, making it difficult for sperm to enter the uterus.  Changing the lining of the uterus. This change makes it more difficult for an egg to  implant. Side effects from estrogen occur more often in the first 2-3 months. Talk  to your health care provider about what side effects may affect you. If you develop persistent side effects or they are severe, talk to your health care provider. PROGESTERONE CONTRACEPTIVES Progesterone-only contraceptives can prevent pregnancy by:   Blocking ovulation. This occurs in many women, but some women will continue to ovulate.   Preventing the entry of sperm into the uterus by keeping the cervical mucus thick and sticky.   Changing the lining of the uterus. This change makes it more difficult for an egg to implant.  Side effects of progesterone can vary. Talk to your health care provider about what side effects may affect you. If you develop persistent side effects or they are severe, talk to your health care provider.  Document Released: 03/06/2007 Document Revised: 10/17/2012 Document Reviewed: 07/29/2012 [facility] Patient Information 2015 [place], [place]. This information is not intended to replace advice given to you by your health care provider. Make sure you discuss any questions you have with your health care provider. Oral Contraception Information Oral contraceptive pills (OCPs) are medicines taken to prevent pregnancy. OCPs work by preventing the ovaries from releasing eggs. The hormones in OCPs also cause the cervical mucus to thicken, preventing the sperm from entering the uterus. The hormones also cause the uterine lining to become thin, not allowing a fertilized egg to attach to the inside of the uterus. OCPs are highly effective when taken exactly as prescribed. However, OCPs do not prevent sexually transmitted diseases (STDs). Safe sex practices, such as using condoms along with the pill, can help prevent STDs.  Before taking the pill, you may have a physical exam and Pap test. Your health care provider may order blood tests. The health care provider will make sure you are a good candidate for oral contraception. Discuss with your health care provider the possible side  effects of the OCP you may be prescribed. When starting an OCP, it can take 2 to 3 months for the body to adjust to the changes in hormone levels in your body.  TYPES OF ORAL CONTRACEPTION  The combination pill--This pill contains estrogen and progestin (synthetic progesterone) hormones. The combination pill comes in 21-day, 28-day, or 91-day packs. Some types of combination pills are meant to be taken continuously (365-day pills). With 21-day packs, you do not take pills for 7 days after the last pill. With 28-day packs, the pill is taken every day. The last 7 pills are without hormones. Certain types of pills have more than 21 hormone-containing pills. With 91-day packs, the first 84 pills contain both hormones, and the last 7 pills contain no hormones or contain estrogen only.  The minipill--This pill contains the progesterone hormone only. The pill is taken every day continuously. It is very important to take the pill at the same time each day. The minipill comes in packs of 28 pills. All 28 pills contain the hormone.  ADVANTAGES OF ORAL CONTRACEPTIVE PILLS  Decreases premenstrual symptoms.   Treats menstrual period cramps.   Regulates the menstrual cycle.   Decreases a heavy menstrual flow.   May treatacne, depending on the type of pill.   Treats abnormal uterine bleeding.   Treats polycystic ovarian syndrome.   Treats endometriosis.   Can be used as emergency contraception.  THINGS THAT CAN MAKE ORAL CONTRACEPTIVE PILLS LESS EFFECTIVE OCPs can be less effective if:   You forget to take the pill at the same time every  day.   You have a stomach or intestinal disease that lessens the absorption of the pill.   You take OCPs with other medicines that make OCPs less effective, such as antibiotics, certain HIV medicines, and some seizure medicines.   You take expired OCPs.   You forget to restart the pill on day 7, when using the packs of 21 pills.  RISKS  ASSOCIATED WITH ORAL CONTRACEPTIVE PILLS  Oral contraceptive pills can sometimes cause side effects, such as:  Headache.  Nausea.  Breast tenderness.  Irregular bleeding or spotting. Combination pills are also associated with a small increased risk of:  Blood clots.  Heart attack.  Stroke. Document Released: 05/07/2002 Document Revised: 12/05/2012 Document Reviewed: 08/05/2012 [facility] Patient Information 2015 [place], [place]. This information is not intended to replace advice given to you by your health care provider. Make sure you discuss any questions you have with your health care provider. Oral Contraception Use Oral contraceptive pills (OCPs) are medicines taken to prevent pregnancy. OCPs work by preventing the ovaries from releasing eggs. The hormones in OCPs also cause the cervical mucus to thicken, preventing the sperm from entering the uterus. The hormones also cause the uterine lining to become thin, not allowing a fertilized egg to attach to the inside of the uterus. OCPs are highly effective when taken exactly as prescribed. However, OCPs do not prevent sexually transmitted diseases (STDs). Safe sex practices, such as using condoms along with an OCP, can help prevent STDs. Before taking OCPs, you may have a physical exam and Pap test. Your health care provider may also order blood tests if necessary. Your health care provider will make sure you are a good candidate for oral contraception. Discuss with your health care provider the possible side effects of the OCP you may be prescribed. When starting an OCP, it can take 2 to 3 months for the body to adjust to the changes in hormone levels in your body.  HOW TO TAKE ORAL CONTRACEPTIVE PILLS Your health care provider may advise you on how to start taking the first cycle of OCPs. Otherwise, you can:   Start on day 1 of your menstrual period. You will not need any backup contraceptive protection with this start time.   Start on  the first Sunday after your menstrual period or the day you get your prescription. In these cases, you will need to use backup contraceptive protection for the first week.   Start the pill at any time of your cycle. If you take the pill within 5 days of the start of your period, you are protected against pregnancy right away. In this case, you will not need a backup form of birth control. If you start at any other time of your menstrual cycle, you will need to use another form of birth control for 7 days. If your OCP is the type called a minipill, it will protect you from pregnancy after taking it for 2 days (48 hours). After you have started taking OCPs:   If you forget to take 1 pill, take it as soon as you remember. Take the next pill at the regular time.   If you miss 2 or more pills, call your health care provider because different pills have different instructions for missed doses. Use backup birth control until your next menstrual period starts.   If you use a 28-day pack that contains inactive pills and you miss 1 of the last 7 pills (pills with no hormones), it will not  matter. Throw away the rest of the non-hormone pills and start a new pill pack.  No matter which day you start the OCP, you will always start a new pack on that same day of the week. Have an extra pack of OCPs and a backup contraceptive method available in case you miss some pills or lose your OCP pack.  HOME CARE INSTRUCTIONS   Do not smoke.   Always use a condom to protect against STDs. OCPs do not protect against STDs.   Use a calendar to mark your menstrual period days.   Read the information and directions that came with your OCP. Talk to your health care provider if you have questions.  SEEK MEDICAL CARE IF:   You develop nausea and vomiting.   You have abnormal vaginal discharge or bleeding.   You develop a rash.   You miss your menstrual period.   You are losing your hair.   You need  treatment for mood swings or depression.   You get dizzy when taking the OCP.   You develop acne from taking the OCP.   You become pregnant.  SEEK IMMEDIATE MEDICAL CARE IF:   You develop chest pain.   You develop shortness of breath.   You have an uncontrolled or severe headache.   You develop numbness or slurred speech.   You develop visual problems.   You develop pain, redness, and swelling in the legs.  Document Released: 02/03/2011 Document Revised: 07/01/2013 Document Reviewed: 08/05/2012 [facility] Patient Information 2015 [place], [place]. This information is not intended to replace advice given to you by your health care provider. Make sure you discuss any questions you have with your health care provider.

## 2013-11-26 LAB — HIV ANTIBODY (ROUTINE TESTING W REFLEX): HIV 1&2 Ab, 4th Generation: NONREACTIVE

## 2013-11-26 LAB — HEPATITIS C ANTIBODY: HCV AB: NEGATIVE

## 2013-11-26 LAB — FERRITIN: FERRITIN: 28 ng/mL (ref 10–291)

## 2013-11-26 LAB — RPR

## 2013-11-27 LAB — GC/CHLAMYDIA PROBE AMP
CT Probe RNA: NEGATIVE
GC Probe RNA: NEGATIVE

## 2013-11-27 LAB — HSV(HERPES SIMPLEX VRS) I + II AB-IGG: HSV 1 Glycoprotein G Ab, IgG: <0.1 IV

## 2013-12-10 ENCOUNTER — Encounter: Payer: Self-pay | Admitting: Family Medicine

## 2013-12-10 ENCOUNTER — Telehealth: Payer: Self-pay

## 2013-12-10 NOTE — Telephone Encounter (Signed)
All were normal and negative.  Letter sent.

## 2013-12-10 NOTE — Telephone Encounter (Signed)
Pt calling about her lab results. Please advise. Thanks

## 2013-12-10 NOTE — Progress Notes (Signed)
LMOM regarding sche appt in 2 wks to f/u on STD testing with Dr. Clelia CroftShaw

## 2013-12-11 NOTE — Telephone Encounter (Signed)
lmom that all labs were normal.

## 2013-12-23 ENCOUNTER — Telehealth: Payer: Self-pay | Admitting: Family Medicine

## 2013-12-23 NOTE — Telephone Encounter (Signed)
Pt stated she did not need a follow up.

## 2013-12-23 NOTE — Telephone Encounter (Signed)
Message copied by Alison MurrayHOLLETT, Tanaisha Pittman L on Mon Dec 23, 2013  3:51 PM ------      Message from: Clelia CroftSHAW, EVA      Created: Mon Nov 25, 2013  6:42 PM       Please see if pt would like to sched a f/u OV w/ me in 2 wks for repeat std testing - test of cure ------

## 2014-03-25 ENCOUNTER — Ambulatory Visit (INDEPENDENT_AMBULATORY_CARE_PROVIDER_SITE_OTHER): Payer: BLUE CROSS/BLUE SHIELD | Admitting: Family Medicine

## 2014-03-25 ENCOUNTER — Encounter: Payer: Self-pay | Admitting: Family Medicine

## 2014-03-25 VITALS — BP 100/62 | HR 84 | Temp 98.4°F | Resp 16 | Ht 66.25 in | Wt 160.0 lb

## 2014-03-25 DIAGNOSIS — N760 Acute vaginitis: Secondary | ICD-10-CM

## 2014-03-25 DIAGNOSIS — R109 Unspecified abdominal pain: Secondary | ICD-10-CM

## 2014-03-25 DIAGNOSIS — B9689 Other specified bacterial agents as the cause of diseases classified elsewhere: Secondary | ICD-10-CM

## 2014-03-25 DIAGNOSIS — Z124 Encounter for screening for malignant neoplasm of cervix: Secondary | ICD-10-CM

## 2014-03-25 DIAGNOSIS — D509 Iron deficiency anemia, unspecified: Secondary | ICD-10-CM

## 2014-03-25 DIAGNOSIS — L02215 Cutaneous abscess of perineum: Secondary | ICD-10-CM

## 2014-03-25 DIAGNOSIS — Z113 Encounter for screening for infections with a predominantly sexual mode of transmission: Secondary | ICD-10-CM

## 2014-03-25 DIAGNOSIS — A499 Bacterial infection, unspecified: Secondary | ICD-10-CM

## 2014-03-25 DIAGNOSIS — N898 Other specified noninflammatory disorders of vagina: Secondary | ICD-10-CM

## 2014-03-25 DIAGNOSIS — Z3201 Encounter for pregnancy test, result positive: Secondary | ICD-10-CM

## 2014-03-25 LAB — POCT WET PREP WITH KOH
KOH Prep POC: NEGATIVE
RBC Wet Prep HPF POC: NEGATIVE
Trichomonas, UA: NEGATIVE
Yeast Wet Prep HPF POC: NEGATIVE

## 2014-03-25 LAB — POCT URINE PREGNANCY: Preg Test, Ur: POSITIVE

## 2014-03-25 MED ORDER — METRONIDAZOLE 0.75 % VA GEL: 1.0000 | Freq: Every day | VAGINAL | Status: AC

## 2014-03-25 NOTE — Patient Instructions (Signed)
Start taking an over the counter prenatal vitamin  Medicines During Pregnancy During pregnancy, there are medicines that are either safe or unsafe to take. Medicines include prescriptions from your caregiver, over-the-counter medicines, topical creams applied to the skin, and all herbal substances. Medicines are put into either Class A, B, C, or D. Class A and B medicines have been shown to be safe in pregnancy. Class C medicines are also considered to be safe in pregnancy, but these medicines should only be used when necessary. Class D medicines should not be used at all in pregnancy. They can be harmful to a baby.  It is best to take as little medicine as possible while pregnant. However, some medicines are necessary to take for the mother and baby's health. Sometimes, it is more dangerous to stop taking certain medicines than to stay on them. This is often the case for people with long-term (chronic) conditions such as asthma, diabetes, or high blood pressure (hypertension). If you are pregnant and have a chronic illness, call your caregiver right away. Bring a list of your medicines and their doses to your appointments. If you are planning to become pregnant, schedule a doctor's appointment and discuss your medicines with your caregiver. Lastly, write down the phone number to your pharmacist. They can answer questions regarding a medicine's class and safety. They cannot give advice as to whether you should or should not be on a medicine.  SAFE AND UNSAFE MEDICINES There is a long list of medicines that are considered safe in pregnancy. Below is a shorter list. For specific medicines, ask your caregiver.  AllergyMedicines Loratadine, cetirizine, and chlorpheniramine are safe to take. Certain nasal steroid sprays are safe. Talk to your caregiver about specific brands that are safe. Analgesics Acetaminophen and acetaminophen with codeine are safe to take. All other nonsteroidal anti-inflammatory drugs  (NSAIDS) are not safe. This includes ibuprofen.  Antacids Many over-the-counter antacids are safe to take. Talk to your caregiver about specific brands that are safe. Famotidine, ranitidine, and lansoprazole are safe. Omepresole is considered safe to take in the second trimester. Antibiotic Medicines There are several antibiotics to avoid. These include, but are not limited to, tetracyline, quinolones, and sulfa medications. Talk to your caregiver before taking any antibiotic.  Antihistamines Talk to your caregiver about specific brands that are safe.  Asthma Medicines Most asthma steroid inhalers are safe to take. Talk to your caregiver for specific details. Calcium Calcium supplements are safe to take. Do not take oyster shell calcium.  Cough and Cold Medicines It is safe to take products with guaifenesin or dextromethorphan. Talk to your caregiver about specific brands that are safe. It is not safe to take products that contain aspirin or ibuprofen. Decongestant Medicines Pseudoephedrine-containing products are safe to take in the second and third trimester.  Depression Medicines Talk about these medicines with your caregiver.  Antidiarrheal Medicines It is safe to take loperamide. Talk to your caregiver about specific brands that are safe. It is not safe to take any antidiarrheal medicine that contains bismuth. Eyedrops Allergy eyedrops should be limited.  Iron It is safe to use certain iron-containing medicines for anemia in pregnancy. They require a prescription.  Antinausea Medicines It is safe to take doxylamine and vitamin B6 as directed. There are other prescription medicines available, if needed.  Sleep aids It is safe to take diphenhydramine and acetaminophen with diphenhydramine.  Steroids Hydrocortisone creams are safe to use as directed. Oral steroids require a prescription. It is not safe to  take any hemorrhoid cream with pramoxine or phenylephrine. Stool  softener It is safe to take stool softener medicines. Avoid daily or prolonged use of stool softeners. Thyroid Medicine It is important to stay on this thyroid medicine. It needs to be followed by your caregiver.  Vaginal Medicines Your caregiver will prescribe a medicine to you if you have a vaginal infection. Certain antifungal medicines are safe to use if you have a sexually transmitted infection (STI). Talk to your caregiver.  Document Released: 02/14/2005 Document Revised: 05/09/2011 Document Reviewed: 02/15/2011 [facility] Patient Information 2015 [place]. This information is not intended to replace advice given to you by your health care provider. Make sure you discuss any questions you have with your health care provider. First Trimester of Pregnancy The first trimester of pregnancy is from week 1 until the end of week 12 (months 1 through 3). A week after a sperm fertilizes an egg, the egg will implant on the wall of the uterus. This embryo will begin to develop into a baby. Genes from you and your partner are forming the baby. The female genes determine whether the baby is a boy or a girl. At 6-8 weeks, the eyes and face are formed, and the heartbeat can be seen on ultrasound. At the end of 12 weeks, all the baby's organs are formed.  Now that you are pregnant, you will want to do everything you can to have a healthy baby. Two of the most important things are to get good prenatal care and to follow your health care provider's instructions. Prenatal care is all the medical care you receive before the baby's birth. This care will help prevent, find, and treat any problems during the pregnancy and childbirth. BODY CHANGES Your body goes through many changes during pregnancy. The changes vary from woman to woman.   You may gain or lose a couple of pounds at first.  You may feel sick to your stomach (nauseous) and throw up (vomit). If the vomiting is uncontrollable, call your health care  provider.  You may tire easily.  You may develop headaches that can be relieved by medicines approved by your health care provider.  You may urinate more often. Painful urination may mean you have a bladder infection.  You may develop heartburn as a result of your pregnancy.  You may develop constipation because certain hormones are causing the muscles that push waste through your intestines to slow down.  You may develop hemorrhoids or swollen, bulging veins (varicose veins).  Your breasts may begin to grow larger and become tender. Your nipples may stick out more, and the tissue that surrounds them (areola) may become darker.  Your gums may bleed and may be sensitive to brushing and flossing.  Dark spots or blotches (chloasma, mask of pregnancy) may develop on your face. This will likely fade after the baby is born.  Your menstrual periods will stop.  You may have a loss of appetite.  You may develop cravings for certain kinds of food.  You may have changes in your emotions from day to day, such as being excited to be pregnant or being concerned that something may go wrong with the pregnancy and baby.  You may have more vivid and strange dreams.  You may have changes in your hair. These can include thickening of your hair, rapid growth, and changes in texture. Some women also have hair loss during or after pregnancy, or hair that feels dry or thin. Your hair will most  likely return to normal after your baby is born. WHAT TO EXPECT AT YOUR PRENATAL VISITS During a routine prenatal visit:  You will be weighed to make sure you and the baby are growing normally.  Your blood pressure will be taken.  Your abdomen will be measured to track your baby's growth.  The fetal heartbeat will be listened to starting around week 10 or 12 of your pregnancy.  Test results from any previous visits will be discussed. Your health care provider may ask you:  How you are feeling.  If you  are feeling the baby move.  If you have had any abnormal symptoms, such as leaking fluid, bleeding, severe headaches, or abdominal cramping.  If you have any questions. Other tests that may be performed during your first trimester include:  Blood tests to find your blood type and to check for the presence of any previous infections. They will also be used to check for low iron levels (anemia) and Rh antibodies. Later in the pregnancy, blood tests for diabetes will be done along with other tests if problems develop.  Urine tests to check for infections, diabetes, or protein in the urine.  An ultrasound to confirm the proper growth and development of the baby.  An amniocentesis to check for possible genetic problems.  Fetal screens for spina bifida and Down syndrome.  You may need other tests to make sure you and the baby are doing well. HOME CARE INSTRUCTIONS  Medicines  Follow your health care provider's instructions regarding medicine use. Specific medicines may be either safe or unsafe to take during pregnancy.  Take your prenatal vitamins as directed.  If you develop constipation, try taking a stool softener if your health care provider approves. Diet  Eat regular, well-balanced meals. Choose a variety of foods, such as meat or vegetable-based protein, fish, milk and low-fat dairy products, vegetables, fruits, and whole grain breads and cereals. Your health care provider will help you determine the amount of weight gain that is right for you.  Avoid raw meat and uncooked cheese. These carry germs that can cause birth defects in the baby.  Eating four or five small meals rather than three large meals a day may help relieve nausea and vomiting. If you start to feel nauseous, eating a few soda crackers can be helpful. Drinking liquids between meals instead of during meals also seems to help nausea and vomiting.  If you develop constipation, eat more high-fiber foods, such as fresh  vegetables or fruit and whole grains. Drink enough fluids to keep your urine clear or pale yellow. Activity and Exercise  Exercise only as directed by your health care provider. Exercising will help you:  Control your weight.  Stay in shape.  Be prepared for labor and delivery.  Experiencing pain or cramping in the lower abdomen or low back is a good sign that you should stop exercising. Check with your health care provider before continuing normal exercises.  Try to avoid standing for long periods of time. Move your legs often if you must stand in one place for a long time.  Avoid heavy lifting.  Wear low-heeled shoes, and practice good posture.  You may continue to have sex unless your health care provider directs you otherwise. Relief of Pain or Discomfort  Wear a good support bra for breast tenderness.   Take warm sitz baths to soothe any pain or discomfort caused by hemorrhoids. Use hemorrhoid cream if your health care provider approves.   Rest  with your legs elevated if you have leg cramps or low back pain.  If you develop varicose veins in your legs, wear support hose. Elevate your feet for 15 minutes, 3-4 times a day. Limit salt in your diet. Prenatal Care  Schedule your prenatal visits by the twelfth week of pregnancy. They are usually scheduled monthly at first, then more often in the last 2 months before delivery.  Write down your questions. Take them to your prenatal visits.  Keep all your prenatal visits as directed by your health care provider. Safety  Wear your seat belt at all times when driving.  Make a list of emergency phone numbers, including numbers for family, friends, the hospital, and police and fire departments. General Tips  Ask your health care provider for a referral to a local prenatal education class. Begin classes no later than at the beginning of month 6 of your pregnancy.  Ask for help if you have counseling or nutritional needs during  pregnancy. Your health care provider can offer advice or refer you to specialists for help with various needs.  Do not use hot tubs, steam rooms, or saunas.  Do not douche or use tampons or scented sanitary pads.  Do not cross your legs for long periods of time.  Avoid cat litter boxes and soil used by cats. These carry germs that can cause birth defects in the baby and possibly loss of the fetus by miscarriage or stillbirth.  Avoid all smoking, herbs, alcohol, and medicines not prescribed by your health care provider. Chemicals in these affect the formation and growth of the baby.  Schedule a dentist appointment. At home, brush your teeth with a soft toothbrush and be gentle when you floss. SEEK MEDICAL CARE IF:   You have dizziness.  You have mild pelvic cramps, pelvic pressure, or nagging pain in the abdominal area.  You have persistent nausea, vomiting, or diarrhea.  You have a bad smelling vaginal discharge.  You have pain with urination.  You notice increased swelling in your face, hands, legs, or ankles. SEEK IMMEDIATE MEDICAL CARE IF:   You have a fever.  You are leaking fluid from your vagina.  You have spotting or bleeding from your vagina.  You have severe abdominal cramping or pain.  You have rapid weight gain or loss.  You vomit blood or material that looks like coffee grounds.  You are exposed to [place] measles and have never had them.  You are exposed to fifth disease or chickenpox.  You develop a severe headache.  You have shortness of breath.  You have any kind of trauma, such as from a fall or a car accident. Document Released: 02/08/2001 Document Revised: 07/01/2013 Document Reviewed: 12/25/2012 [facility] Patient Information 2015 [place], [place]. This information is not intended to replace advice given to you by your health care provider. Make sure you discuss any questions you have with your health care provider.

## 2014-03-25 NOTE — Progress Notes (Signed)
Subjective:    Patient ID: Cynthia Guerra, female    DOB: Nov 28, 1990, 24 y.o.   MRN: [id number]  HPI This is a 24 yo female who presents today with 2 weeks of intermittent abdominal pain- cramping pain with burning on sides. Has had more bowel movements. 2x every 2-3 days. Has noticed pain is worse with dairy products.   LMP 02/10/14, she used Plan B about 2 weeks ago. She has had beige vaginal discharge, some odor.   Not using birth control, using Plan B when she has unprotected intercourse. Her boyfriend lives in [place]. She was there visiting last month. She is planning on moving there in March. No breast tenderness, no nausea. "Feels weird."  Patient reports having frequent ingrown hairs on her perineum. She uses noxema with good clearing and sometimes the area drains spontaneously.   Review of Systems No dysuria, no hematuria, no frequency, no nausea.    Objective:   Physical Exam  Constitutional: She appears well-developed and well-nourished.  Eyes: Conjunctivae are normal.  Neck: Normal range of motion. Neck supple.  Cardiovascular: Normal rate.   Pulmonary/Chest: Effort normal.  Abdominal: Soft. Bowel sounds are normal. She exhibits no distension and no mass. There is no tenderness. There is no rebound and no guarding.  Genitourinary: Vagina normal and uterus normal.    Pelvic exam was performed with patient supine. There is no rash, tenderness, lesion or injury on the right labia. There is tenderness on the left labia. There is no rash, lesion or injury on the left labia. Cervix exhibits no motion tenderness and no friability. Discharge: thick yellow. Right adnexum displays no mass, no tenderness and no fullness. Left adnexum displays no mass, no tenderness and no fullness. No erythema, tenderness or bleeding in the vagina. No signs of injury around the vagina. Vaginal discharge: thick yellow.  Vitals reviewed.  BP 100/62 mmHg  Pulse 84  Temp(Src) 98.4 F (36.9 C) (Oral)   Resp 16  Ht 5' 6.25" (1.683 m)  Wt 160 lb (72.576 kg)  BMI 25.62 kg/m2  SpO2 100%  LMP 02/10/2014 (Exact Date)  Results for orders placed or performed in visit on 03/25/14  POCT urine pregnancy  Result Value Ref Range   Preg Test, Ur Positive   POCT Wet Prep with KOH  Result Value Ref Range   Trichomonas, UA Negative    Clue Cells Wet Prep HPF POC tntc    Epithelial Wet Prep HPF POC tntc    Yeast Wet Prep HPF POC neg    Bacteria Wet Prep HPF POC 5+    RBC Wet Prep HPF POC neg    WBC Wet Prep HPF POC tntc    KOH Prep POC Negative       Assessment & Plan:  1. Abdominal cramping - POCT urine pregnancy - hCG, quantitative, pregnancy  2. Iron deficiency anemia - CBC  3. Vaginal discharge - POCT Wet Prep with KOH  4. Screening for cervical cancer - Pap IG and Chlamydia/Gonococcus, NAA  5. Routine screening for STI (sexually transmitted infection) - HIV antibody - HSV(herpes simplex vrs) 1+2 ab-IgG - RPR  6. 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  -Encouraged warm soaks -RTC if redness, warmth, increased pain, fever  8. BV (bacterial vaginosis) -meronidazole gel 0.75%, qhs x 7 days.  Emi Belfasteborah B. Gessner, FNP-BC  [facility]  03/25/2014 1:52 PM

## 2014-03-26 LAB — CBC
HCT: 35.3 % — ABNORMAL LOW (ref 36.0–46.0)
Hemoglobin: 11 g/dL — ABNORMAL LOW (ref 12.0–15.0)
MCH: 23.2 pg — ABNORMAL LOW (ref 26.0–34.0)
MCHC: 31.2 g/dL (ref 30.0–36.0)
MCV: 74.3 fL — AB (ref 78.0–100.0)
MPV: 9.8 fL (ref 8.6–12.4)
Platelets: 387 10*3/uL (ref 150–400)
RBC: 4.75 MIL/uL (ref 3.87–5.11)
RDW: 16 % — ABNORMAL HIGH (ref 11.5–15.5)
WBC: 10.6 10*3/uL — ABNORMAL HIGH (ref 4.0–10.5)

## 2014-03-26 LAB — HCG, QUANTITATIVE, PREGNANCY: hCG, Beta Chain, Quant, S: 1538.7 m[IU]/mL

## 2014-03-26 LAB — HIV ANTIBODY (ROUTINE TESTING W REFLEX): HIV: NONREACTIVE

## 2014-03-26 LAB — RPR

## 2014-03-27 LAB — PAP IG AND CT-NG NAA
CHLAMYDIA PROBE AMP: NEGATIVE
GC PROBE AMP: NEGATIVE

## 2014-03-27 LAB — HSV(HERPES SIMPLEX VRS) I + II AB-IGG: HSV 2 Glycoprotein G Ab, IgG: <0.1 IV

## 2014-04-03 ENCOUNTER — Encounter: Payer: Self-pay | Admitting: Family Medicine
